# Patient Record
Sex: Male | Born: 2000 | Race: White | Hispanic: Yes | Marital: Single
Health system: Southern US, Community
[De-identification: ages and names within clinical notes are randomized; demographics above are authoritative.]

## PROBLEM LIST (undated history)

## (undated) DIAGNOSIS — N200 Calculus of kidney: Secondary | ICD-10-CM

---
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
## 2021-07-03 IMAGING — US US RENAL
1 series · 14 of 25 positions shown · non-contrast
Comparison: January 01, 2017

CLINICAL DATA: Left flank pain.  History of stones.

EXAM:
RENAL / URINARY TRACT ULTRASOUND COMPLETE

[Series 1: us renal · 14 of 55 slices shown]
[im 1/55]
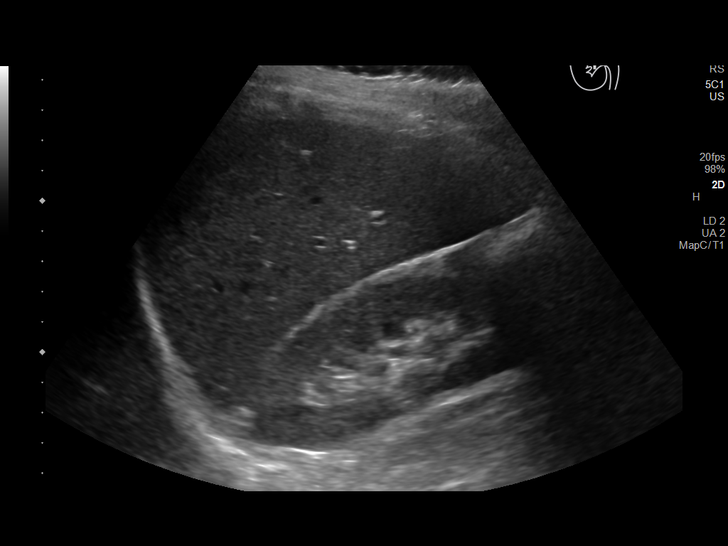
[im 5/55]
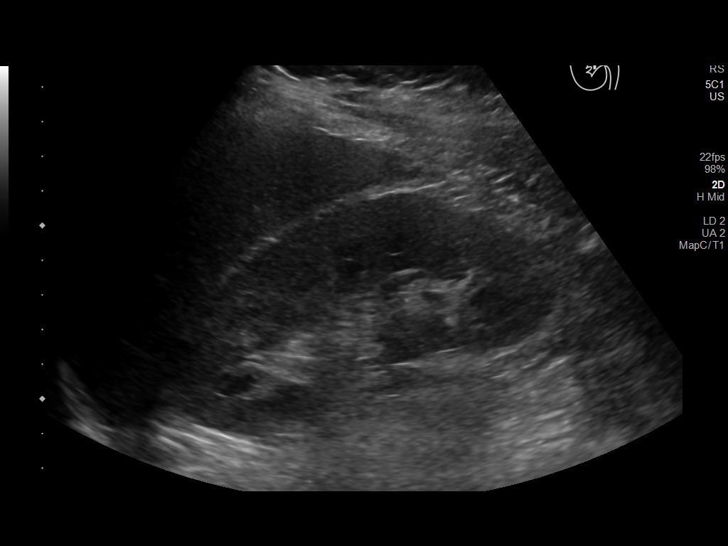
[im 10/55]
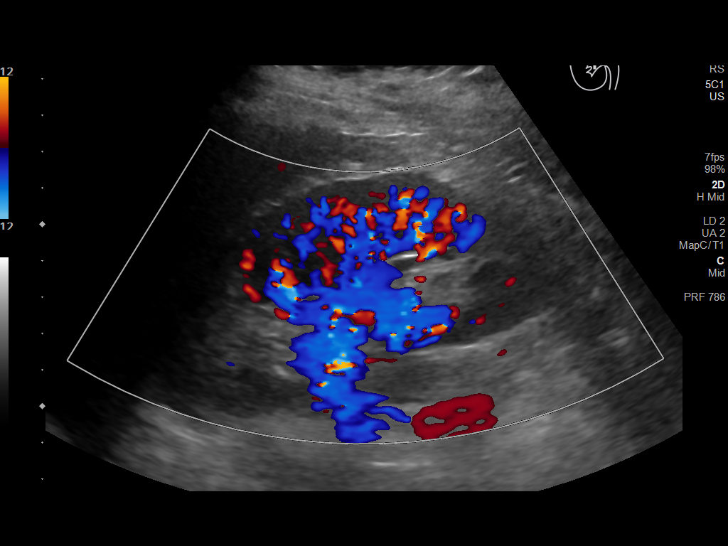
[im 14/55]
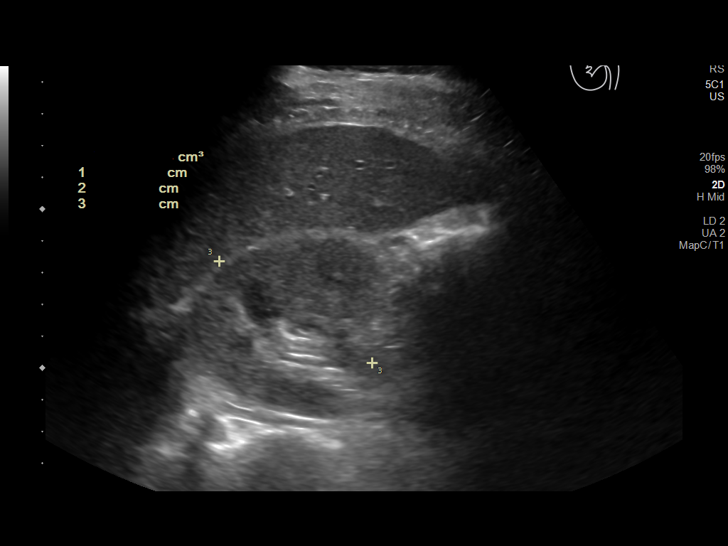
[im 19/55]
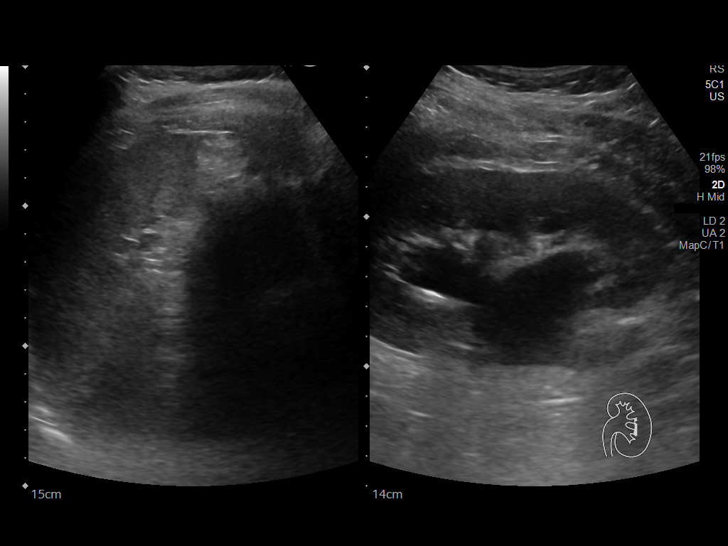
[im 21/55]
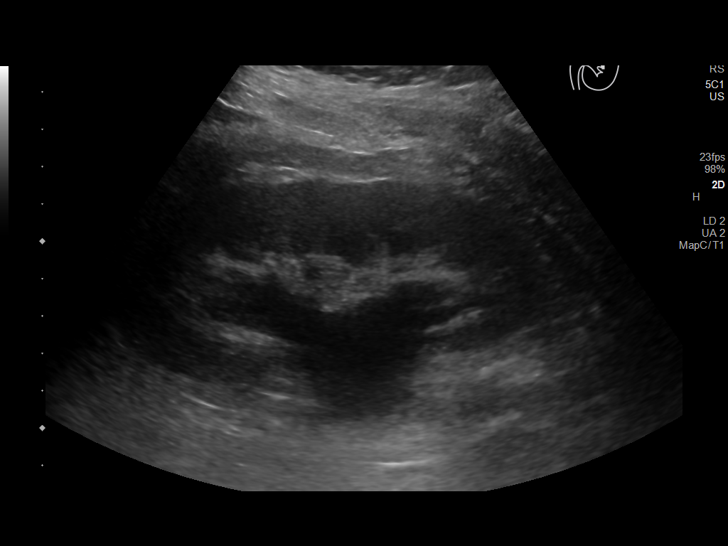
[im 25/55]
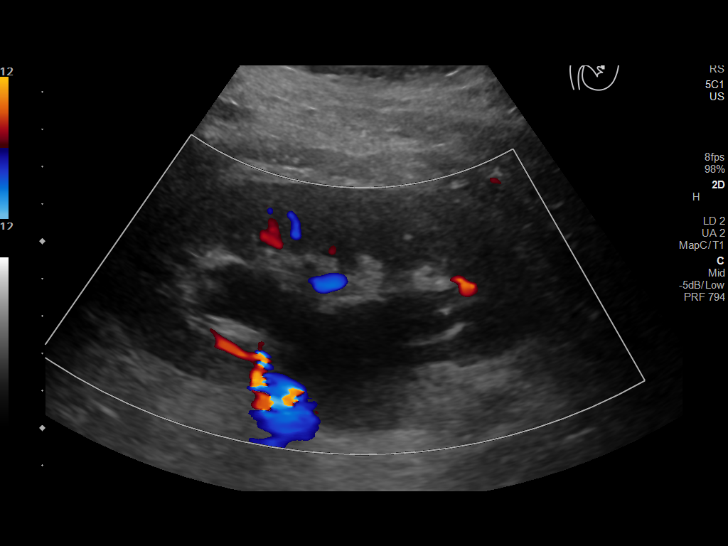
[im 30/55]
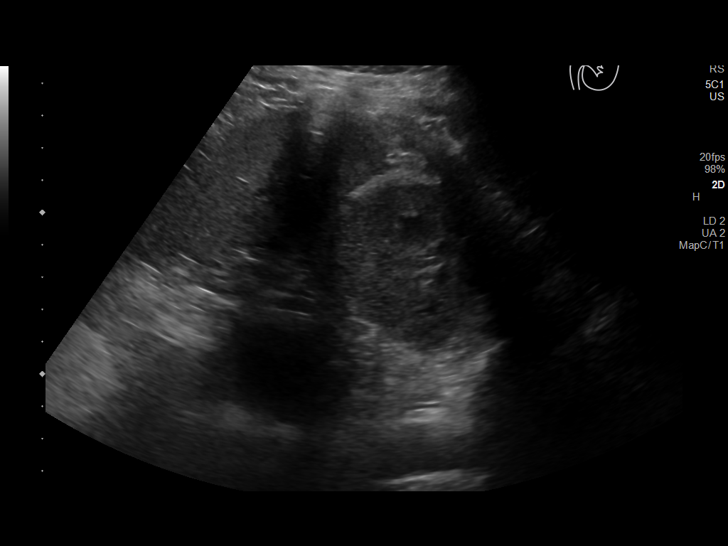
[im 34/55]
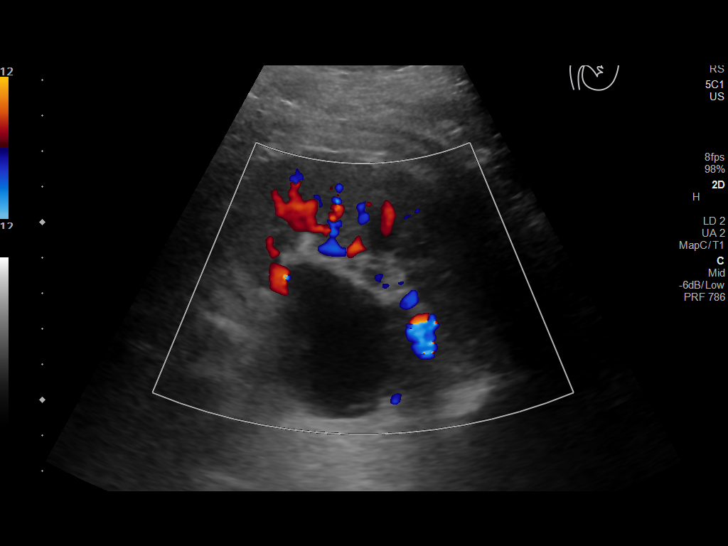
[im 37/55]
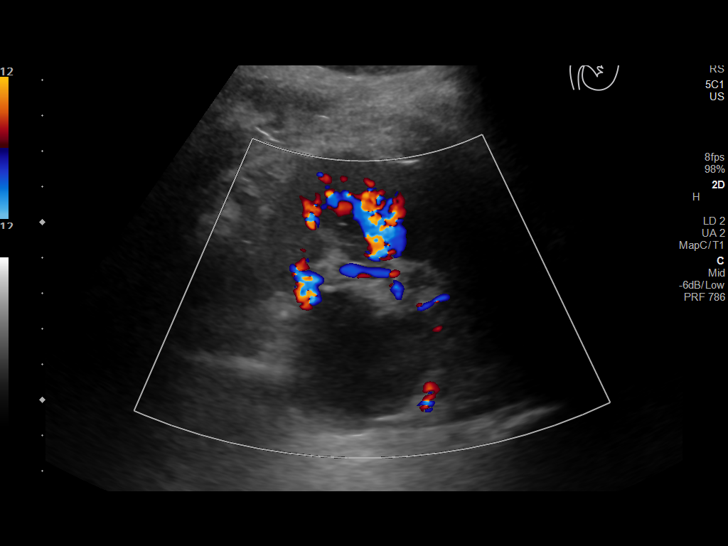
[im 41/55]
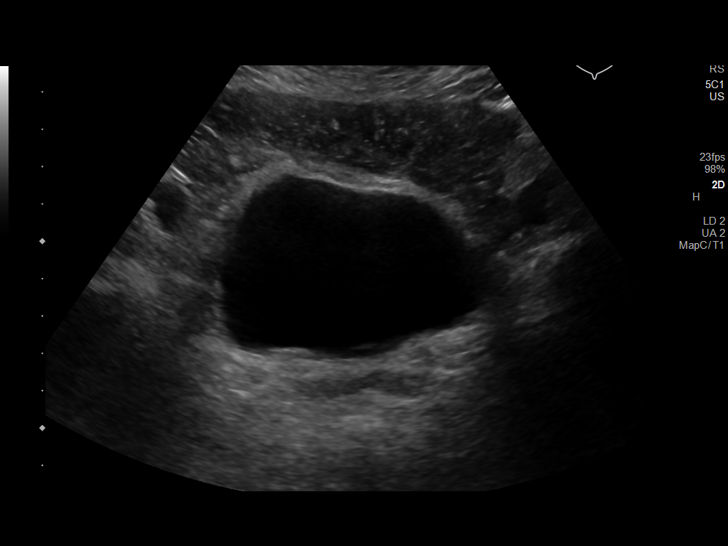
[im 46/55]
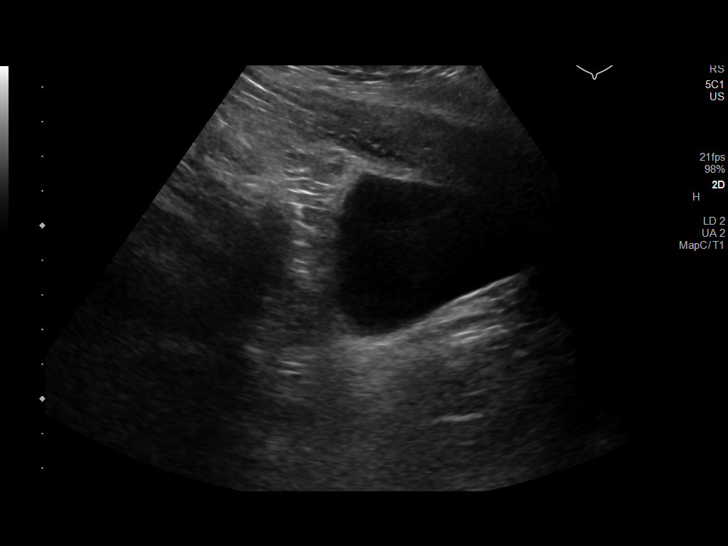
[im 50/55]
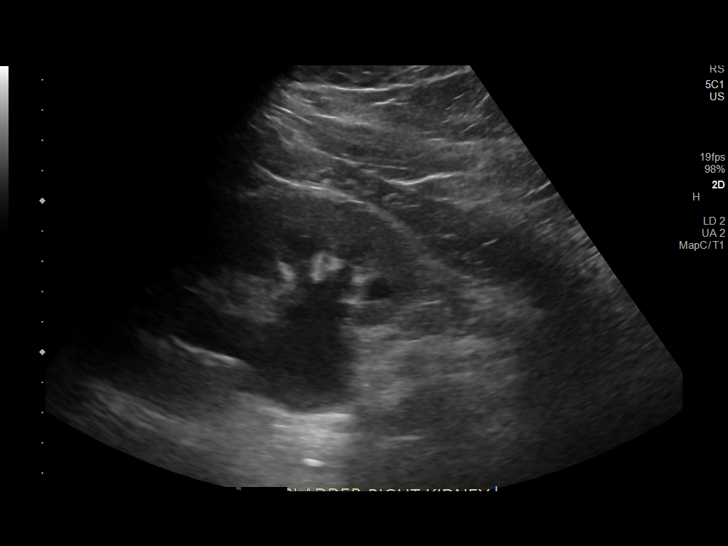
[im 55/55]
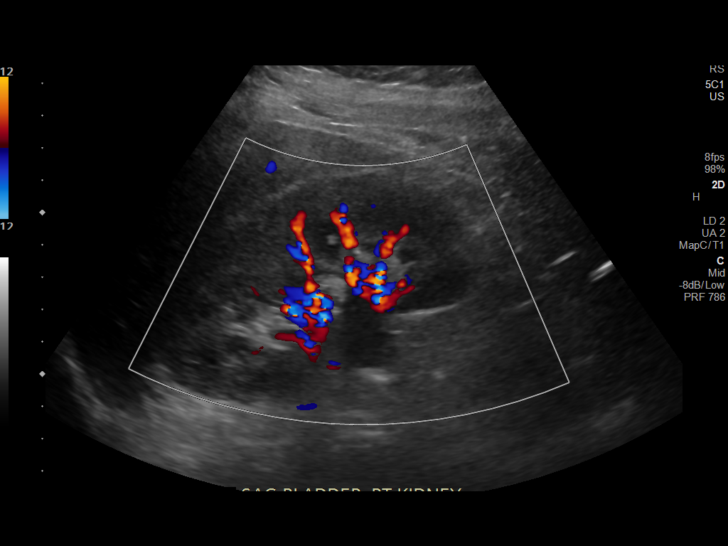

[14 of 25 positions shown; findings below may reference images not displayed]

FINDINGS: Right Kidney:

Renal measurements: 11.8 x 5.8 x 5.8 cm = volume: 205.2 mL. The
patient has mild right-sided pelvicaliectasis which does not resolve
with voiding.

Left Kidney:

Renal measurements: 12.2 x 5.2 x 7.0 cm = volume: The patient has
moderate left-sided hydronephrosis which is worse when compared to
December 2016 and does not resolve with voiding. ML. Echogenicity
within normal limits. No mass or hydronephrosis visualized.

Bladder:

Appears normal for degree of bladder distention.
IMPRESSION: 1. Moderate left hydronephrosis and mild right hydronephrosis. The
bilateral hydronephrosis does not resolve on postvoid imaging. While
the findings are age indeterminate, the degree of hydronephrosis on
the left has increased compared to 0604 and I am suspicious for a
left ureteral stone. A CT scan could further evaluate for ureteral
stones and acuity of obstruction as clinically warranted.
